# Patient Record
Sex: Female | Born: 1970 | Race: Black or African American | Hispanic: No | Marital: Single | Smoking: Current every day smoker
Health system: Southern US, Community
[De-identification: ages and names within clinical notes are randomized; demographics above are authoritative.]

## PROBLEM LIST (undated history)

## (undated) HISTORY — PX: TONSILLECTOMY: SUR1361

## (undated) HISTORY — PX: TUBAL LIGATION: SHX77

---

## 2015-10-25 ENCOUNTER — Encounter (HOSPITAL_BASED_OUTPATIENT_CLINIC_OR_DEPARTMENT_OTHER): Payer: Self-pay

## 2015-10-25 ENCOUNTER — Emergency Department (HOSPITAL_BASED_OUTPATIENT_CLINIC_OR_DEPARTMENT_OTHER)
Admission: EM | Admit: 2015-10-25 | Discharge: 2015-10-25 | Discharge status: Against Medical Advice | Payer: No Typology Code available for payment source | Attending: Emergency Medicine | Admitting: Emergency Medicine

## 2015-10-25 DIAGNOSIS — Y9389 Activity, other specified: Secondary | ICD-10-CM | POA: Insufficient documentation

## 2015-10-25 DIAGNOSIS — Y92410 Unspecified street and highway as the place of occurrence of the external cause: Secondary | ICD-10-CM | POA: Insufficient documentation

## 2015-10-25 DIAGNOSIS — Y998 Other external cause status: Secondary | ICD-10-CM | POA: Diagnosis not present

## 2015-10-25 DIAGNOSIS — S59912A Unspecified injury of left forearm, initial encounter: Secondary | ICD-10-CM | POA: Insufficient documentation

## 2015-10-25 DIAGNOSIS — F17210 Nicotine dependence, cigarettes, uncomplicated: Secondary | ICD-10-CM | POA: Diagnosis not present

## 2015-10-25 NOTE — ED Notes (Signed)
Pt stated I'm okay, informed pt doctor had signed up for them and they should be over shortly.  Pt stated again I'm fine.
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
## 2018-05-21 ENCOUNTER — Emergency Department (HOSPITAL_BASED_OUTPATIENT_CLINIC_OR_DEPARTMENT_OTHER): Payer: 59

## 2018-05-21 ENCOUNTER — Inpatient Hospital Stay (HOSPITAL_BASED_OUTPATIENT_CLINIC_OR_DEPARTMENT_OTHER)
Admission: EM | Admit: 2018-05-21 | Discharge: 2018-05-23 | DRG: 392 | Disposition: A | Discharge status: Home / Self Care | Payer: 59 | Attending: Internal Medicine | Admitting: Internal Medicine

## 2018-05-21 ENCOUNTER — Encounter (HOSPITAL_BASED_OUTPATIENT_CLINIC_OR_DEPARTMENT_OTHER): Payer: Self-pay | Admitting: Emergency Medicine

## 2018-05-21 ENCOUNTER — Other Ambulatory Visit: Payer: Self-pay

## 2018-05-21 DIAGNOSIS — N83202 Unspecified ovarian cyst, left side: Secondary | ICD-10-CM | POA: Diagnosis present

## 2018-05-21 DIAGNOSIS — K5720 Diverticulitis of large intestine with perforation and abscess without bleeding: Secondary | ICD-10-CM | POA: Diagnosis not present

## 2018-05-21 DIAGNOSIS — K5792 Diverticulitis of intestine, part unspecified, without perforation or abscess without bleeding: Secondary | ICD-10-CM | POA: Diagnosis present

## 2018-05-21 DIAGNOSIS — R112 Nausea with vomiting, unspecified: Secondary | ICD-10-CM | POA: Diagnosis present

## 2018-05-21 DIAGNOSIS — F17210 Nicotine dependence, cigarettes, uncomplicated: Secondary | ICD-10-CM | POA: Diagnosis present

## 2018-05-21 LAB — CBC WITH DIFFERENTIAL/PLATELET
Abs Immature Granulocytes: 0.06 10*3/uL (ref 0.00–0.07)
BASOS PCT: 0 %
Basophils Absolute: 0 10*3/uL (ref 0.0–0.1)
EOS ABS: 0 10*3/uL (ref 0.0–0.5)
EOS PCT: 0 %
HCT: 40.3 % (ref 36.0–46.0)
Hemoglobin: 13.2 g/dL (ref 12.0–15.0)
Immature Granulocytes: 1 %
Lymphocytes Relative: 19 %
Lymphs Abs: 2.4 10*3/uL (ref 0.7–4.0)
MCH: 28 pg (ref 26.0–34.0)
MCHC: 32.8 g/dL (ref 30.0–36.0)
MCV: 85.6 fL (ref 80.0–100.0)
MONO ABS: 1.2 10*3/uL — AB (ref 0.1–1.0)
MONOS PCT: 10 %
Neutro Abs: 9 10*3/uL — ABNORMAL HIGH (ref 1.7–7.7)
Neutrophils Relative %: 70 %
PLATELETS: 207 10*3/uL (ref 150–400)
RBC: 4.71 MIL/uL (ref 3.87–5.11)
RDW: 13.3 % (ref 11.5–15.5)
WBC: 12.7 10*3/uL — ABNORMAL HIGH (ref 4.0–10.5)
nRBC: 0 % (ref 0.0–0.2)

## 2018-05-21 LAB — COMPREHENSIVE METABOLIC PANEL
ALT: 17 U/L (ref 0–44)
AST: 14 U/L — AB (ref 15–41)
Albumin: 3.8 g/dL (ref 3.5–5.0)
Alkaline Phosphatase: 67 U/L (ref 38–126)
Anion gap: 9 (ref 5–15)
BILIRUBIN TOTAL: 0.5 mg/dL (ref 0.3–1.2)
BUN: 10 mg/dL (ref 6–20)
CALCIUM: 9.3 mg/dL (ref 8.9–10.3)
CO2: 25 mmol/L (ref 22–32)
Chloride: 104 mmol/L (ref 98–111)
Creatinine, Ser: 0.84 mg/dL (ref 0.44–1.00)
GFR calc Af Amer: >60 mL/min (ref >60)
Glucose, Bld: 176 mg/dL — ABNORMAL HIGH (ref 70–99)
Potassium: 3.4 mmol/L — ABNORMAL LOW (ref 3.5–5.1)
Sodium: 138 mmol/L (ref 135–145)
TOTAL PROTEIN: 7 g/dL (ref 6.5–8.1)

## 2018-05-21 LAB — LIPASE, BLOOD: LIPASE: 30 U/L (ref 11–51)

## 2018-05-21 MED ORDER — SODIUM CHLORIDE 0.9 % IV SOLN
INTRAVENOUS | Status: DC
  Administered 2018-05-21 (×2): via INTRAVENOUS

## 2018-05-21 MED ORDER — ENOXAPARIN SODIUM 40 MG/0.4ML ~~LOC~~ SOLN
40.0000 mg | SUBCUTANEOUS | Status: DC
  Administered 2018-05-21 – 2018-05-22 (×2): 40 mg via SUBCUTANEOUS
  Filled 2018-05-21 (×2): qty 0.4

## 2018-05-21 MED ORDER — CIPROFLOXACIN IN D5W 400 MG/200ML IV SOLN
400.0000 mg | Freq: Two times a day (BID) | INTRAVENOUS | Status: DC
  Administered 2018-05-21: 400 mg via INTRAVENOUS
  Filled 2018-05-21: qty 200

## 2018-05-21 MED ORDER — IOPAMIDOL (ISOVUE-300) INJECTION 61%
30.0000 mL | Freq: Once | INTRAVENOUS | Status: AC | PRN
  Administered 2018-05-21: 15 mL via ORAL

## 2018-05-21 MED ORDER — METRONIDAZOLE IN NACL 5-0.79 MG/ML-% IV SOLN
500.0000 mg | Freq: Three times a day (TID) | INTRAVENOUS | Status: DC
  Administered 2018-05-21 – 2018-05-22 (×2): 500 mg via INTRAVENOUS
  Filled 2018-05-21 (×2): qty 100

## 2018-05-21 MED ORDER — ENOXAPARIN SODIUM 40 MG/0.4ML ~~LOC~~ SOLN: 40.0000 mg | SUBCUTANEOUS | Status: DC

## 2018-05-21 MED ORDER — POTASSIUM CHLORIDE 10 MEQ/100ML IV SOLN
10.0000 meq | INTRAVENOUS | Status: AC
  Administered 2018-05-21 (×2): 10 meq via INTRAVENOUS
  Filled 2018-05-21 (×2): qty 100

## 2018-05-21 MED ORDER — CIPROFLOXACIN IN D5W 400 MG/200ML IV SOLN
400.0000 mg | Freq: Once | INTRAVENOUS | Status: AC
  Administered 2018-05-21: 400 mg via INTRAVENOUS
  Filled 2018-05-21: qty 200

## 2018-05-21 MED ORDER — SODIUM CHLORIDE 0.9 % IV SOLN: INTRAVENOUS | Status: DC | PRN

## 2018-05-21 MED ORDER — HYDROMORPHONE HCL 1 MG/ML IJ SOLN
0.5000 mg | INTRAMUSCULAR | Status: DC | PRN
  Administered 2018-05-21: 0.5 mg via INTRAVENOUS
  Filled 2018-05-21: qty 0.5

## 2018-05-21 MED ORDER — SODIUM CHLORIDE 0.9 % IV SOLN
INTRAVENOUS | Status: DC
  Administered 2018-05-21 – 2018-05-22 (×3): via INTRAVENOUS

## 2018-05-21 MED ORDER — METRONIDAZOLE IN NACL 5-0.79 MG/ML-% IV SOLN
500.0000 mg | Freq: Once | INTRAVENOUS | Status: DC
  Filled 2018-05-21: qty 100

## 2018-05-21 MED ORDER — IOPAMIDOL (ISOVUE-300) INJECTION 61%
100.0000 mL | Freq: Once | INTRAVENOUS | Status: AC | PRN
  Administered 2018-05-21: 100 mL via INTRAVENOUS

## 2018-05-21 NOTE — ED Triage Notes (Signed)
Abd pain across upper abdomen since noon yesterday.  Vomited twice yesterday.  At some bread today and had ginger ale today and did not vomit again. Denies diarrhea.

## 2018-05-21 NOTE — ED Notes (Signed)
Patient transported to CT 

## 2018-05-21 NOTE — H&P (Addendum)
History and Physical    Chelsea Rowe ZOX:[id number] DOB: 1970-09-19 DOA: 05/21/2018  PCP: [facility] Patient coming from: Home    Chief Complaint: Abdominal pain  HPI: Chelsea Rowe is a 47 y.o. female with no significant past medical history transferred from [facility] with abdominal pain started day prior to admission to the hospital associated with nausea and vomiting.  She denied any fever but had chills at home no diarrhea or constipation.  She denies history of IBS.  She denies chest pain shortness of breath cough.  She denies urinary complaints.  No history of diverticulitis.  As soon as she reached the room she asked for food and she wanted to eat.  ED Course: Patient received Cipro and Flagyl ED physician spoke with Dr. Cliffton Asters with general surgery who has already seen the patient.  CT scan of the abdomen and pelvis showed sigmoid diverticulitis with question contained perforation.    Review of Systems: See HPI.   History reviewed. No pertinent past medical history.  Past Surgical History:  Procedure Laterality Date  . TONSILLECTOMY    . TUBAL LIGATION       reports that she has been smoking cigarettes. She has been smoking about 0.50 packs per day. She has never used smokeless tobacco. She reports that she does not drink alcohol or use drugs.  No Known Allergies  History reviewed. No pertinent family history.   Prior to Admission medications   Medication Sig Start Date End Date Taking? Authorizing Provider  simethicone (MYLICON) 80 MG chewable tablet Chew 80 mg by mouth daily as needed for flatulence.   Yes [provider]    Physical Exam: Vitals:   05/21/18 0855 05/21/18 1144 05/21/18 1356 05/21/18 1558  BP:  111/77 117/67 132/89  Pulse:  90 86 97  Resp:  20 18 20   Temp:    99.8 F (37.7 C)  TempSrc:    Oral  SpO2:  99% 100% 100%  Weight: 99.8 kg     Height: 5\' 5"  (1.651 m)       Constitutional:  NAD, calm, comfortable Vitals:   05/21/18 0855 05/21/18 1144 05/21/18 1356 05/21/18 1558  BP:  111/77 117/67 132/89  Pulse:  90 86 97  Resp:  20 18 20   Temp:    99.8 F (37.7 C)  TempSrc:    Oral  SpO2:  99% 100% 100%  Weight: 99.8 kg     Height: 5\' 5"  (1.651 m)      Eyes: PERRL, lids and conjunctivae normal ENMT: Mucous membranes are moist. Posterior pharynx clear of any exudate or lesions.Normal dentition.  Neck: normal, supple, no masses, no thyromegaly Respiratory: clear to auscultation bilaterally, no wheezing, no crackles. Normal respiratory effort. No accessory muscle use.  Cardiovascular: Regular rate and rhythm, no murmurs / rubs / gallops. No extremity edema. 2+ pedal pulses. No carotid bruits.  Abdomen:mild tender  Lower abdomen tenderness, no masses palpated. No hepatosplenomegaly. Bowel sounds positive.  Musculoskeletal: no clubbing / cyanosis. No joint deformity upper and lower extremities. Good ROM, no contractures. Normal muscle tone.  Skin: no rashes, lesions, ulcers. No induration Neurologic: CN 2-12 grossly intact. Sensation intact, DTR normal. Strength 5/5 in all 4.  Psychiatric: Normal judgment and insight. Alert and oriented x 3. Normal mood.   Labs on Admission: I have personally reviewed following labs and imaging studies  CBC: Recent Labs  Lab 05/21/18 1013  WBC 12.7*  NEUTROABS 9.0*  HGB 13.2  HCT  40.3  MCV 85.6  PLT 207   Basic Metabolic Panel: Recent Labs  Lab 05/21/18 1013  NA 138  K 3.4*  CL 104  CO2 25  GLUCOSE 176*  BUN 10  CREATININE 0.84  CALCIUM 9.3   GFR: Estimated Creatinine Clearance: 100 mL/min (by C-G formula based on SCr of 0.84 mg/dL). Liver Function Tests: Recent Labs  Lab 05/21/18 1013  AST 14*  ALT 17  ALKPHOS 67  BILITOT 0.5  PROT 7.0  ALBUMIN 3.8   Recent Labs  Lab 05/21/18 1013  LIPASE 30   No results for input(s): AMMONIA in the last 168 hours. Coagulation Profile: No results for input(s): INR,  PROTIME in the last 168 hours. Cardiac Enzymes: No results for input(s): CKTOTAL, CKMB, CKMBINDEX, TROPONINI in the last 168 hours. BNP (last 3 results) No results for input(s): PROBNP in the last 8760 hours. HbA1C: No results for input(s): HGBA1C in the last 72 hours. CBG: No results for input(s): GLUCAP in the last 168 hours. Lipid Profile: No results for input(s): CHOL, HDL, LDLCALC, TRIG, CHOLHDL, LDLDIRECT in the last 72 hours. Thyroid Function Tests: No results for input(s): TSH, T4TOTAL, FREET4, T3FREE, THYROIDAB in the last 72 hours. Anemia Panel: No results for input(s): VITAMINB12, FOLATE, FERRITIN, TIBC, IRON, RETICCTPCT in the last 72 hours. Urine analysis: No results found for: COLORURINE, APPEARANCEUR, LABSPEC, PHURINE, GLUCOSEU, HGBUR, BILIRUBINUR, KETONESUR, PROTEINUR, UROBILINOGEN, NITRITE, LEUKOCYTESUR  Radiological Exams on Admission: Ct Abdomen Pelvis W Contrast  Result Date: 05/21/2018 CLINICAL DATA:  Acute upper abdominal pain. EXAM: CT ABDOMEN AND PELVIS WITH CONTRAST TECHNIQUE: Multidetector CT imaging of the abdomen and pelvis was performed using the standard protocol following bolus administration of intravenous contrast. CONTRAST:  15mL ISOVUE-300 IOPAMIDOL (ISOVUE-300) INJECTION 61% orally, ISOVUE-300 IOPAMIDOL (ISOVUE-300) INJECTION 61% intravenously. COMPARISON:  None. FINDINGS: Lower chest: No acute abnormality. Hepatobiliary: No focal liver abnormality is seen. No gallstones, gallbladder wall thickening, or biliary dilatation. Pancreas: Unremarkable. No pancreatic ductal dilatation or surrounding inflammatory changes. Spleen: Normal in size without focal abnormality. Adrenals/Urinary Tract: Adrenal glands are unremarkable. Kidneys are normal, without renal calculi, focal lesion, or hydronephrosis. Bladder is unremarkable. Stomach/Bowel: The stomach and appendix appear normal. There is no evidence of bowel obstruction. Sigmoid diverticulitis is noted with  collection of fluid and gas measuring 3.8 x 2.6 cm adjacent to sigmoid colon consistent with contained perforation and developing phlegmon or abscess. There is wall thickening of adjacent terminal ileum which most likely is secondary inflammation. Vascular/Lymphatic: No significant vascular findings are present. No enlarged abdominal or pelvic lymph nodes. Reproductive: Uterus and right ovary are unremarkable. 4.1 cm left ovarian cyst is noted. Other: Small amount of free fluid is noted in the pelvis which may be inflammatory in etiology. Musculoskeletal: No acute or significant osseous findings. IMPRESSION: Sigmoid diverticulitis is noted with adjacent 3.8 x 2.6 cm abnormality containing gas and fluid concerning for contained perforation and developing phlegmon or abscess. This is resulting in secondary inflammatory changes involving the adjacent terminal ileum. 4.1 cm left ovarian cyst is noted. Ultrasound is recommended for further evaluation. Electronically Signed   By: Lupita Raider, M.D.   On: 05/21/2018 11:59   Assessment/Plan Active Problems:   Diverticulitis #1 acute diverticulitis involving the sigmoid colon with CT findings secondary to inflammatory changes phlegmon versus abscess versus contained perforation.  General surgery following.  Patient kept n.p.o. on IV fluids and IV antibiotics. DVT prophylaxis: Lovenox Code Status: Full code Family Communication: None Disposition Plan: Pending clinical improvement Consults  called: General surgery Admission status: Inpatient   Alwyn Ren MD [facility] If 7PM-7AM, please contact night-coverage [email or web address] Password [email or web address]  05/21/2018, 5:22 PM

## 2018-05-21 NOTE — ED Provider Notes (Signed)
[facility] EMERGENCY DEPARTMENT Provider Note   CSN: [id number] Arrival date & time: 05/21/18  4098     History   Chief Complaint Chief Complaint  Patient presents with  . Abdominal Pain    HPI Chelsea Rowe is a 47 y.o. female.   Patient with complaint of abdominal pain across the umbilicus area.  Started at noontime yesterday.  Yesterday she vomited twice.  Nausea has persisted no diarrhea.  No blood in the vomit.  Pain has persisted.  Denies fevers.  Denies dysuria.  Bowel movements without blood.  No history of pain like this in the past.     History reviewed. No pertinent past medical history.  There are no active problems to display for this patient.   Past Surgical History:  Procedure Laterality Date  . TONSILLECTOMY    . TUBAL LIGATION       OB History   None      Home Medications    Prior to Admission medications   Not on File    Family History No family history on file.  Social History Social History   Tobacco Use  . Smoking status: Current Every Day Smoker    Packs/day: 0.50    Types: Cigarettes  . Smokeless tobacco: Never Used  Substance Use Topics  . Alcohol use: No  . Drug use: No     Allergies   Patient has no known allergies.   Review of Systems Review of Systems  Constitutional: Negative for fever.  HENT: Negative for congestion.   Eyes: Negative for redness and visual disturbance.  Respiratory: Negative for shortness of breath.   Cardiovascular: Negative for chest pain.  Gastrointestinal: Positive for abdominal pain, nausea and vomiting. Negative for blood in stool and diarrhea.  Genitourinary: Negative for dysuria.  Musculoskeletal: Negative for back pain.  Skin: Negative for rash.  Neurological: Negative for syncope and headaches.  Hematological: Does not bruise/bleed easily.  Psychiatric/Behavioral: Negative for confusion.     Physical Exam Updated Vital Signs BP 111/77 (BP Location: Left  Arm)   Pulse 90   Temp 98.8 F (37.1 C) (Oral)   Resp 20   Ht 1.651 m (5\' 5" )   Wt 99.8 kg   LMP 05/07/2018   SpO2 99%   BMI 36.61 kg/m   Physical Exam  Constitutional: She is oriented to person, place, and time. She appears well-developed and well-nourished. No distress.  HENT:  Head: Normocephalic and atraumatic.  Mouth/Throat: Oropharynx is clear and moist.  Eyes: Pupils are equal, round, and reactive to light. Conjunctivae and EOM are normal.  Neck: Neck supple.  Cardiovascular: Normal rate, regular rhythm and normal heart sounds.  Pulmonary/Chest: Effort normal and breath sounds normal.  Abdominal: Soft. Bowel sounds are normal. There is tenderness.  Some mild tenderness left side of abdomen.  Musculoskeletal: Normal range of motion. She exhibits no edema.  Neurological: She is alert and oriented to person, place, and time. No cranial nerve deficit or sensory deficit. She exhibits normal muscle tone. Coordination normal.  Skin: Skin is warm. No rash noted.  Nursing note and vitals reviewed.    ED Treatments / Results  Labs (all labs ordered are listed, but only abnormal results are displayed) Labs Reviewed  COMPREHENSIVE METABOLIC PANEL - Abnormal; Notable for the following components:      Result Value   Potassium 3.4 (*)    Glucose, Bld 176 (*)    AST 14 (*)    All other components within normal  limits  CBC WITH DIFFERENTIAL/PLATELET - Abnormal; Notable for the following components:   WBC 12.7 (*)    Neutro Abs 9.0 (*)    Monocytes Absolute 1.2 (*)    All other components within normal limits  LIPASE, BLOOD    EKG None  Radiology Ct Abdomen Pelvis W Contrast  Result Date: 05/21/2018 CLINICAL DATA:  Acute upper abdominal pain. EXAM: CT ABDOMEN AND PELVIS WITH CONTRAST TECHNIQUE: Multidetector CT imaging of the abdomen and pelvis was performed using the standard protocol following bolus administration of intravenous contrast. CONTRAST:  15mL ISOVUE-300  IOPAMIDOL (ISOVUE-300) INJECTION 61% orally, ISOVUE-300 IOPAMIDOL (ISOVUE-300) INJECTION 61% intravenously. COMPARISON:  None. FINDINGS: Lower chest: No acute abnormality. Hepatobiliary: No focal liver abnormality is seen. No gallstones, gallbladder wall thickening, or biliary dilatation. Pancreas: Unremarkable. No pancreatic ductal dilatation or surrounding inflammatory changes. Spleen: Normal in size without focal abnormality. Adrenals/Urinary Tract: Adrenal glands are unremarkable. Kidneys are normal, without renal calculi, focal lesion, or hydronephrosis. Bladder is unremarkable. Stomach/Bowel: The stomach and appendix appear normal. There is no evidence of bowel obstruction. Sigmoid diverticulitis is noted with collection of fluid and gas measuring 3.8 x 2.6 cm adjacent to sigmoid colon consistent with contained perforation and developing phlegmon or abscess. There is wall thickening of adjacent terminal ileum which most likely is secondary inflammation. Vascular/Lymphatic: No significant vascular findings are present. No enlarged abdominal or pelvic lymph nodes. Reproductive: Uterus and right ovary are unremarkable. 4.1 cm left ovarian cyst is noted. Other: Small amount of free fluid is noted in the pelvis which may be inflammatory in etiology. Musculoskeletal: No acute or significant osseous findings. IMPRESSION: Sigmoid diverticulitis is noted with adjacent 3.8 x 2.6 cm abnormality containing gas and fluid concerning for contained perforation and developing phlegmon or abscess. This is resulting in secondary inflammatory changes involving the adjacent terminal ileum. 4.1 cm left ovarian cyst is noted. Ultrasound is recommended for further evaluation. Electronically Signed   By: Lupita Raider, M.D.   On: 05/21/2018 11:59    Procedures Procedures (including critical care time)  Medications Ordered in ED Medications  0.9 %  sodium chloride infusion ( Intravenous Stopped 05/21/18 1250)    ciprofloxacin (CIPRO) IVPB 400 mg (400 mg Intravenous New Bag/Given 05/21/18 1255)  metroNIDAZOLE (FLAGYL) IVPB 500 mg (has no administration in time range)  0.9 %  sodium chloride infusion (1,000 mLs Intravenous New Bag/Given 05/21/18 1253)  iopamidol (ISOVUE-300) 61 % injection 30 mL (15 mLs Oral Contrast Given 05/21/18 1112)  iopamidol (ISOVUE-300) 61 % injection 100 mL (100 mLs Intravenous Contrast Given 05/21/18 1113)     Initial Impression / Assessment and Plan / ED Course  I have reviewed the triage vital signs and the nursing notes.  Pertinent labs & imaging results that were available during my care of the patient were reviewed by me and considered in my medical decision making (see chart for details).    CT scan shows evidence of sigmoid diverticulitis.  There appears to be a contained perforation questionable abscess or phlegmon development.  Patient nontoxic no acute distress here.  Discussed with Dr. Cliffton Asters on call for general surgery at [facility] [place].  He reviewed her CT scan.  He agrees with plan for admission and IV antibiotics and observation due to this finding.  Patient has a known history of diverticulitis in the past.  But this would be considered a complication.  Patient not showing any signs of generalized peritonitis.  No signs of sepsis.  Patient started on IV  Cipro and IV Flagyl based on these findings.  Discussed with hospitalist at [facility] they will admit her to the MedSurg unit there.  Labs without significant abnormalities other than a mild leukocytosis with a white count of 12,000.   Final Clinical Impressions(s) / ED Diagnoses   Final diagnoses:  Abscess of sigmoid colon due to diverticulitis    ED Discharge Orders    None       Vanetta Mulders, MD 05/21/18 1430

## 2018-05-21 NOTE — Consult Note (Signed)
CC: Diverticulitis - consult by Dr. Rogene Houston  HPI: Chelsea Rowe is an 47 y.o. female with no known past medical hx whom presented to [facility] ED today complaining of crampy and constant abdominal pain in the hypogastrium and RLQ which began yesterday around noon. Pain does not radiate. Had n/v x1 yesterday. Thinks she may have had a less severe version of this in the weeks prior. Subjective chills at home. Having flatus through all this; denies diarrhea or constipation  She has never had a colonoscopy  She denies personal or FHx of colorectal cancer, IBS, Crohn's disease or ulcerative colitis  History reviewed. No pertinent past medical history.  Past Surgical History:  Procedure Laterality Date  . TONSILLECTOMY    . TUBAL LIGATION      History reviewed. No pertinent family history.  Social:  reports that she has been smoking cigarettes. She has been smoking about 0.50 packs per day. She has never used smokeless tobacco. She reports that she does not drink alcohol or use drugs.  Allergies: No Known Allergies  Medications: I have reviewed the patient's current medications.  Results for orders placed or performed during the hospital encounter of 05/21/18 (from the past 48 hour(s))  Comprehensive metabolic panel     Status: Abnormal   Collection Time: 05/21/18 10:13 AM  Result Value Ref Range   Sodium 138 135 - 145 mmol/L   Potassium 3.4 (L) 3.5 - 5.1 mmol/L   Chloride 104 98 - 111 mmol/L   CO2 25 22 - 32 mmol/L   Glucose, Bld 176 (H) 70 - 99 mg/dL   BUN 10 6 - 20 mg/dL   Creatinine, Ser 0.84 0.44 - 1.00 mg/dL   Calcium 9.3 8.9 - 10.3 mg/dL   Total Protein 7.0 6.5 - 8.1 g/dL   Albumin 3.8 3.5 - 5.0 g/dL   AST 14 (L) 15 - 41 U/L   ALT 17 0 - 44 U/L   Alkaline Phosphatase 67 38 - 126 U/L   Total Bilirubin 0.5 0.3 - 1.2 mg/dL   GFR calc non Af Amer >60 >60 mL/min   GFR calc Af Amer >60 >60 mL/min    Comment: (NOTE) The eGFR has been calculated using the CKD EPI  equation. This calculation has not been validated in all clinical situations. eGFR's persistently <60 mL/min signify possible Chronic Kidney Disease.    Anion gap 9 5 - 15    Comment: Performed at [facility], [address] [place], [place] [address]  Lipase, blood     Status: None   Collection Time: 05/21/18 10:13 AM  Result Value Ref Range   Lipase 30 11 - 51 U/L    Comment: Performed at [facility], [address] [place], [place] [address]  CBC with Differential/Platelet     Status: Abnormal   Collection Time: 05/21/18 10:13 AM  Result Value Ref Range   WBC 12.7 (H) 4.0 - 10.5 K/uL   RBC 4.71 3.87 - 5.11 MIL/uL   Hemoglobin 13.2 12.0 - 15.0 g/dL   HCT 40.3 36.0 - 46.0 %   MCV 85.6 80.0 - 100.0 fL   MCH 28.0 26.0 - 34.0 pg   MCHC 32.8 30.0 - 36.0 g/dL   RDW 13.3 11.5 - 15.5 %   Platelets 207 150 - 400 K/uL   nRBC 0.0 0.0 - 0.2 %   Neutrophils Relative % 70 %   Neutro Abs 9.0 (H) 1.7 - 7.7 K/uL  Lymphocytes Relative 19 %   Lymphs Abs 2.4 0.7 - 4.0 K/uL   Monocytes Relative 10 %   Monocytes Absolute 1.2 (H) 0.1 - 1.0 K/uL   Eosinophils Relative 0 %   Eosinophils Absolute 0.0 0.0 - 0.5 K/uL   Basophils Relative 0 %   Basophils Absolute 0.0 0.0 - 0.1 K/uL   Immature Granulocytes 1 %   Abs Immature Granulocytes 0.06 0.00 - 0.07 K/uL    Comment: Performed at [facility], [address] [place], [place] [address]    Ct Abdomen Pelvis W Contrast  Result Date: 05/21/2018 CLINICAL DATA:  Acute upper abdominal pain. EXAM: CT ABDOMEN AND PELVIS WITH CONTRAST TECHNIQUE: Multidetector CT imaging of the abdomen and pelvis was performed using the standard protocol following bolus administration of intravenous contrast. CONTRAST:  32m ISOVUE-300 IOPAMIDOL (ISOVUE-300) INJECTION 61% orally, 1076mISOVUE-300 IOPAMIDOL (ISOVUE-300) INJECTION 61% intravenously. COMPARISON:  None. FINDINGS: Lower chest: No acute abnormality. Hepatobiliary: No focal  liver abnormality is seen. No gallstones, gallbladder wall thickening, or biliary dilatation. Pancreas: Unremarkable. No pancreatic ductal dilatation or surrounding inflammatory changes. Spleen: Normal in size without focal abnormality. Adrenals/Urinary Tract: Adrenal glands are unremarkable. Kidneys are normal, without renal calculi, focal lesion, or hydronephrosis. Bladder is unremarkable. Stomach/Bowel: The stomach and appendix appear normal. There is no evidence of bowel obstruction. Sigmoid diverticulitis is noted with collection of fluid and gas measuring 3.8 x 2.6 cm adjacent to sigmoid colon consistent with contained perforation and developing phlegmon or abscess. There is wall thickening of adjacent terminal ileum which most likely is secondary inflammation. Vascular/Lymphatic: No significant vascular findings are present. No enlarged abdominal or pelvic lymph nodes. Reproductive: Uterus and right ovary are unremarkable. 4.1 cm left ovarian cyst is noted. Other: Small amount of free fluid is noted in the pelvis which may be inflammatory in etiology. Musculoskeletal: No acute or significant osseous findings. IMPRESSION: Sigmoid diverticulitis is noted with adjacent 3.8 x 2.6 cm abnormality containing gas and fluid concerning for contained perforation and developing phlegmon or abscess. This is resulting in secondary inflammatory changes involving the adjacent terminal ileum. 4.1 cm left ovarian cyst is noted. Ultrasound is recommended for further evaluation. Electronically Signed   By: JaMarijo ConceptionM.D.   On: 05/21/2018 11:59    ROS - all of the below systems have been reviewed with the patient and positives are indicated with bold text General: chills, fever or night sweats Eyes: blurry vision or double vision ENT: epistaxis or sore throat Allergy/Immunology: itchy/watery eyes or nasal congestion Hematologic/Lymphatic: bleeding problems, blood clots or swollen lymph nodes Endocrine: temperature  intolerance or unexpected weight changes Breast: new or changing breast lumps or nipple discharge Resp: cough, shortness of breath, or wheezing CV: chest pain or dyspnea on exertion GI: as per HPI GU: dysuria, trouble voiding, or hematuria MSK: joint pain or joint stiffness Neuro: TIA or stroke symptoms Derm: pruritus and skin lesion changes Psych: anxiety and depression  PE Blood pressure 132/89, pulse 97, temperature 99.8 F (37.7 C), temperature source Oral, resp. rate 20, height 5' 5"  (1.651 m), weight 99.8 kg, last menstrual period 05/07/2018, SpO2 100 %. Constitutional: NAD; conversant; no deformities Eyes: Moist conjunctiva; no lid lag; anicteric; PERRL Neck: Trachea midline; no thyromegaly Lungs: Normal respiratory effort; no tactile fremitus CV: RRR; no palpable thrills; no pitting edema GI: Abd obese, soft, mildly ttp in RLQ and hypogastrium; no palpable hepatosplenomegaly. No rebound. No guarding. Not significantly distended. MSK: Normal gait; no clubbing/cyanosis Psychiatric: Appropriate  affect; alert and oriented x3 Lymphatic: No palpable cervical or axillary lymphadenopathy  Results for orders placed or performed during the hospital encounter of 05/21/18 (from the past 48 hour(s))  Comprehensive metabolic panel     Status: Abnormal   Collection Time: 05/21/18 10:13 AM  Result Value Ref Range   Sodium 138 135 - 145 mmol/L   Potassium 3.4 (L) 3.5 - 5.1 mmol/L   Chloride 104 98 - 111 mmol/L   CO2 25 22 - 32 mmol/L   Glucose, Bld 176 (H) 70 - 99 mg/dL   BUN 10 6 - 20 mg/dL   Creatinine, Ser 0.84 0.44 - 1.00 mg/dL   Calcium 9.3 8.9 - 10.3 mg/dL   Total Protein 7.0 6.5 - 8.1 g/dL   Albumin 3.8 3.5 - 5.0 g/dL   AST 14 (L) 15 - 41 U/L   ALT 17 0 - 44 U/L   Alkaline Phosphatase 67 38 - 126 U/L   Total Bilirubin 0.5 0.3 - 1.2 mg/dL   GFR calc non Af Amer >60 >60 mL/min   GFR calc Af Amer >60 >60 mL/min    Comment: (NOTE) The eGFR has been calculated using the CKD EPI  equation. This calculation has not been validated in all clinical situations. eGFR's persistently <60 mL/min signify possible Chronic Kidney Disease.    Anion gap 9 5 - 15    Comment: Performed at [facility], [address] [place], [place] [address]  Lipase, blood     Status: None   Collection Time: 05/21/18 10:13 AM  Result Value Ref Range   Lipase 30 11 - 51 U/L    Comment: Performed at [facility], [address] [place], [place] [address]  CBC with Differential/Platelet     Status: Abnormal   Collection Time: 05/21/18 10:13 AM  Result Value Ref Range   WBC 12.7 (H) 4.0 - 10.5 K/uL   RBC 4.71 3.87 - 5.11 MIL/uL   Hemoglobin 13.2 12.0 - 15.0 g/dL   HCT 40.3 36.0 - 46.0 %   MCV 85.6 80.0 - 100.0 fL   MCH 28.0 26.0 - 34.0 pg   MCHC 32.8 30.0 - 36.0 g/dL   RDW 13.3 11.5 - 15.5 %   Platelets 207 150 - 400 K/uL   nRBC 0.0 0.0 - 0.2 %   Neutrophils Relative % 70 %   Neutro Abs 9.0 (H) 1.7 - 7.7 K/uL   Lymphocytes Relative 19 %   Lymphs Abs 2.4 0.7 - 4.0 K/uL   Monocytes Relative 10 %   Monocytes Absolute 1.2 (H) 0.1 - 1.0 K/uL   Eosinophils Relative 0 %   Eosinophils Absolute 0.0 0.0 - 0.5 K/uL   Basophils Relative 0 %   Basophils Absolute 0.0 0.0 - 0.1 K/uL   Immature Granulocytes 1 %   Abs Immature Granulocytes 0.06 0.00 - 0.07 K/uL    Comment: Performed at [facility], [address] [place], [place] [address]    Ct Abdomen Pelvis W Contrast  Result Date: 05/21/2018 CLINICAL DATA:  Acute upper abdominal pain. EXAM: CT ABDOMEN AND PELVIS WITH CONTRAST TECHNIQUE: Multidetector CT imaging of the abdomen and pelvis was performed using the standard protocol following bolus administration of intravenous contrast. CONTRAST:  72m ISOVUE-300 IOPAMIDOL (ISOVUE-300) INJECTION 61% orally, 1064mISOVUE-300 IOPAMIDOL (ISOVUE-300) INJECTION 61% intravenously. COMPARISON:  None. FINDINGS: Lower chest: No acute abnormality. Hepatobiliary: No focal  liver abnormality is seen. No gallstones, gallbladder wall thickening,  or biliary dilatation. Pancreas: Unremarkable. No pancreatic ductal dilatation or surrounding inflammatory changes. Spleen: Normal in size without focal abnormality. Adrenals/Urinary Tract: Adrenal glands are unremarkable. Kidneys are normal, without renal calculi, focal lesion, or hydronephrosis. Bladder is unremarkable. Stomach/Bowel: The stomach and appendix appear normal. There is no evidence of bowel obstruction. Sigmoid diverticulitis is noted with collection of fluid and gas measuring 3.8 x 2.6 cm adjacent to sigmoid colon consistent with contained perforation and developing phlegmon or abscess. There is wall thickening of adjacent terminal ileum which most likely is secondary inflammation. Vascular/Lymphatic: No significant vascular findings are present. No enlarged abdominal or pelvic lymph nodes. Reproductive: Uterus and right ovary are unremarkable. 4.1 cm left ovarian cyst is noted. Other: Small amount of free fluid is noted in the pelvis which may be inflammatory in etiology. Musculoskeletal: No acute or significant osseous findings. IMPRESSION: Sigmoid diverticulitis is noted with adjacent 3.8 x 2.6 cm abnormality containing gas and fluid concerning for contained perforation and developing phlegmon or abscess. This is resulting in secondary inflammatory changes involving the adjacent terminal ileum. 4.1 cm left ovarian cyst is noted. Ultrasound is recommended for further evaluation. Electronically Signed   By: Marijo Conception, M.D.   On: 05/21/2018 11:59    A/P: Chelsea Rowe is an 47 y.o. female with sigmoid diverticulitis and area of phlegmon 3.8 x 2.6 cm  - secondary inflammatory changes noted involving adjacent TI  -NPO, MIVF -IV abx -Repeat labs in AM -Will follow with you; no planned acute surgical interventions at this juncture -Should she improve, discussed that she will need a colonoscopy as an outpatient  in ~8wks out from this attack -We discussed the anatomy and physiology of the GI tract with her.  We discussed the pathophysiology of diverticulitis.  We discussed the treatment plan moving forward, I answered her questions to her satisfaction and she voiced understanding with the plan  Sharon Mt. Dema Severin, M.D. [facility]

## 2018-05-21 NOTE — ED Notes (Signed)
Report to Calvin, RN at [facility].  

## 2018-05-22 DIAGNOSIS — K5792 Diverticulitis of intestine, part unspecified, without perforation or abscess without bleeding: Secondary | ICD-10-CM

## 2018-05-22 LAB — CBC
HCT: 36.4 % (ref 36.0–46.0)
HEMOGLOBIN: 11.6 g/dL — AB (ref 12.0–15.0)
MCH: 27.6 pg (ref 26.0–34.0)
MCHC: 31.9 g/dL (ref 30.0–36.0)
MCV: 86.5 fL (ref 80.0–100.0)
Platelets: 195 10*3/uL (ref 150–400)
RBC: 4.21 MIL/uL (ref 3.87–5.11)
RDW: 13.3 % (ref 11.5–15.5)
WBC: 9.1 10*3/uL (ref 4.0–10.5)
nRBC: 0 % (ref 0.0–0.2)

## 2018-05-22 LAB — COMPREHENSIVE METABOLIC PANEL
ALT: 13 U/L (ref 0–44)
AST: 11 U/L — ABNORMAL LOW (ref 15–41)
Albumin: 3.4 g/dL — ABNORMAL LOW (ref 3.5–5.0)
Alkaline Phosphatase: 62 U/L (ref 38–126)
Anion gap: 8 (ref 5–15)
BILIRUBIN TOTAL: 0.8 mg/dL (ref 0.3–1.2)
BUN: 8 mg/dL (ref 6–20)
CHLORIDE: 107 mmol/L (ref 98–111)
CO2: 23 mmol/L (ref 22–32)
Calcium: 8.5 mg/dL — ABNORMAL LOW (ref 8.9–10.3)
Creatinine, Ser: 0.71 mg/dL (ref 0.44–1.00)
GFR calc Af Amer: >60 mL/min (ref >60)
GLUCOSE: 130 mg/dL — AB (ref 70–99)
Potassium: 3.6 mmol/L (ref 3.5–5.1)
SODIUM: 138 mmol/L (ref 135–145)
TOTAL PROTEIN: 6.5 g/dL (ref 6.5–8.1)

## 2018-05-22 MED ORDER — CIPROFLOXACIN HCL 500 MG PO TABS
500.0000 mg | ORAL_TABLET | Freq: Two times a day (BID) | ORAL | Status: DC
  Administered 2018-05-22 – 2018-05-23 (×3): 500 mg via ORAL
  Filled 2018-05-22 (×3): qty 1

## 2018-05-22 MED ORDER — METRONIDAZOLE 500 MG PO TABS
500.0000 mg | ORAL_TABLET | Freq: Three times a day (TID) | ORAL | Status: DC
  Administered 2018-05-22 – 2018-05-23 (×4): 500 mg via ORAL
  Filled 2018-05-22 (×4): qty 1

## 2018-05-22 MED ORDER — SACCHAROMYCES BOULARDII 250 MG PO CAPS
250.0000 mg | ORAL_CAPSULE | Freq: Two times a day (BID) | ORAL | Status: DC
  Administered 2018-05-22 – 2018-05-23 (×3): 250 mg via ORAL
  Filled 2018-05-22 (×3): qty 1

## 2018-05-22 NOTE — Progress Notes (Signed)
[facility] Progress Note     Subjective: CC-  States that she is feeling better today than yesterday. Currently she only reports very mild, dull lower abdominal pain. Denies n/v. 1 loose BM yesterday.  WBC 9.1, afebrile.  Objective: Vital signs in last 24 hours: Temp:  [98.2 F (36.8 C)-99.8 F (37.7 C)] 98.2 F (36.8 C) (10/25 0513) Pulse Rate:  [80-104] 80 (10/25 0513) Resp:  [17-20] 17 (10/25 0513) BP: (111-132)/(65-89) 112/80 (10/25 0513) SpO2:  [99 %-100 %] 99 % (10/25 0513) Weight:  [99.8 kg] 99.8 kg (10/24 0855) Last BM Date: 05/21/18  Intake/Output from previous day: 10/24 0701 - 10/25 0700 In: 2967.9 [I.V.:1459.3; IV Piggyback:1508.6] Out: -  Intake/Output this shift: No intake/output data recorded.  PE: Gen:  Alert, NAD, pleasant HEENT: EOM's intact, pupils equal and round Card:  RRR Pulm:  CTAB, no W/R/R, effort normal Abd: Soft, NT/ND, +BS, no HSM Ext:  Calves soft and nontender Psych: A&Ox3  Skin: no rashes noted, warm and dry  Lab Results:  Recent Labs    05/21/18 1013 05/22/18 0604  WBC 12.7* 9.1  HGB 13.2 11.6*  HCT 40.3 36.4  PLT 207 195   BMET Recent Labs    05/21/18 1013 05/22/18 0604  NA 138 138  K 3.4* 3.6  CL 104 107  CO2 25 23  GLUCOSE 176* 130*  BUN 10 8  CREATININE 0.84 0.71  CALCIUM 9.3 8.5*   PT/INR No results for input(s): LABPROT, INR in the last 72 hours. CMP     Component Value Date/Time   NA 138 05/22/2018 0604   K 3.6 05/22/2018 0604   CL 107 05/22/2018 0604   CO2 23 05/22/2018 0604   GLUCOSE 130 (H) 05/22/2018 0604   BUN 8 05/22/2018 0604   CREATININE 0.71 05/22/2018 0604   CALCIUM 8.5 (L) 05/22/2018 0604   PROT 6.5 05/22/2018 0604   ALBUMIN 3.4 (L) 05/22/2018 0604   AST 11 (L) 05/22/2018 0604   ALT 13 05/22/2018 0604   ALKPHOS 62 05/22/2018 0604   BILITOT 0.8 05/22/2018 0604   GFRNONAA >60 05/22/2018 0604   GFRAA >60 05/22/2018 0604   Lipase     Component Value Date/Time   LIPASE  30 05/21/2018 1013       Studies/Results: Ct Abdomen Pelvis W Contrast  Result Date: 05/21/2018 CLINICAL DATA:  Acute upper abdominal pain. EXAM: CT ABDOMEN AND PELVIS WITH CONTRAST TECHNIQUE: Multidetector CT imaging of the abdomen and pelvis was performed using the standard protocol following bolus administration of intravenous contrast. CONTRAST:  15mL ISOVUE-300 IOPAMIDOL (ISOVUE-300) INJECTION 61% orally, ISOVUE-300 IOPAMIDOL (ISOVUE-300) INJECTION 61% intravenously. COMPARISON:  None. FINDINGS: Lower chest: No acute abnormality. Hepatobiliary: No focal liver abnormality is seen. No gallstones, gallbladder wall thickening, or biliary dilatation. Pancreas: Unremarkable. No pancreatic ductal dilatation or surrounding inflammatory changes. Spleen: Normal in size without focal abnormality. Adrenals/Urinary Tract: Adrenal glands are unremarkable. Kidneys are normal, without renal calculi, focal lesion, or hydronephrosis. Bladder is unremarkable. Stomach/Bowel: The stomach and appendix appear normal. There is no evidence of bowel obstruction. Sigmoid diverticulitis is noted with collection of fluid and gas measuring 3.8 x 2.6 cm adjacent to sigmoid colon consistent with contained perforation and developing phlegmon or abscess. There is wall thickening of adjacent terminal ileum which most likely is secondary inflammation. Vascular/Lymphatic: No significant vascular findings are present. No enlarged abdominal or pelvic lymph nodes. Reproductive: Uterus and right ovary are unremarkable. 4.1 cm left ovarian cyst is noted. Other: Small amount  of free fluid is noted in the pelvis which may be inflammatory in etiology. Musculoskeletal: No acute or significant osseous findings. IMPRESSION: Sigmoid diverticulitis is noted with adjacent 3.8 x 2.6 cm abnormality containing gas and fluid concerning for contained perforation and developing phlegmon or abscess. This is resulting in secondary inflammatory changes  involving the adjacent terminal ileum. 4.1 cm left ovarian cyst is noted. Ultrasound is recommended for further evaluation. Electronically Signed   By: Lupita Raider, M.D.   On: 05/21/2018 11:59    Anti-infectives: Anti-infectives (From admission, onward)   Start     Dose/Rate Route Frequency Ordered Stop   05/22/18 0815  ciprofloxacin (CIPRO) tablet 500 mg     500 mg Oral 2 times daily 05/22/18 0814     05/22/18 0815  metroNIDAZOLE (FLAGYL) tablet 500 mg     500 mg Oral Every 8 hours 05/22/18 0814     05/22/18 0000  ciprofloxacin (CIPRO) IVPB 400 mg  Status:  Discontinued     400 mg 200 mL/hr over 60 Minutes Intravenous Every 12 hours 05/21/18 1728 05/22/18 0814   05/21/18 2200  metroNIDAZOLE (FLAGYL) IVPB 500 mg  Status:  Discontinued     500 mg 100 mL/hr over 60 Minutes Intravenous Every 8 hours 05/21/18 1728 05/22/18 0814   05/21/18 1300  ciprofloxacin (CIPRO) IVPB 400 mg     400 mg 200 mL/hr over 60 Minutes Intravenous  Once 05/21/18 1247 05/21/18 1355   05/21/18 1300  metroNIDAZOLE (FLAGYL) IVPB 500 mg  Status:  Discontinued     500 mg 100 mL/hr over 60 Minutes Intravenous  Once 05/21/18 1247 05/22/18 4098       Assessment/Plan Sigmoid diverticulitis with phlegmon 3.8 x 2.6 cm   - CT scan 10/24 shows sigmoid diverticulitis is noted with adjacent 3.8 x 2.6 cm abnormality containing gas and fluid concerning for contained perforation and developing phlegmon or abscess; secondary inflammatory changes involving the adjacent terminal ileum - never had a colonoscopy before; if this acute flare up resolves with medical management she will need a colonoscopy in about 8 weeks  ID - IV cipro/flagyl 10/24>>10/25, PO cipro/flagyl 10/25>> FEN - IVF, CLD VTE - SCDs, ok for chemical DVT prophylaxis from surgical standpoint Foley - none  Plan - Pain improved, WBC WNL, and VSS. Advance to clear liquids and transition to oral antibiotics. Encourage ambulation.   LOS: 1 day    Franne Forts , [facility] 05/22/2018, 8:15 AM Pager: [phone] Mon 7:00 am -11:30 AM Tues-Fri 7:00 am-4:30 pm Sat-Sun 7:00 am-11:30 am

## 2018-05-22 NOTE — Progress Notes (Signed)
PROGRESS NOTE    Chelsea Rowe  ZOX:[id number] DOB: 1971-01-09 DOA: 05/21/2018 PCP: [facility]   Brief Narrative: Patient is a 47 year old female with no significant past medical history was transferred from [facility] for the management of diverticulitis.  She presented to the emergency department with complaints of abdominal pain with associated nausea and vomiting.  CT abdomen/pelvis showed sigmoid diverticulitis with question of contained perforation.  General surgery consulted.  Started on antibiotics.  Assessment & Plan:   Active Problems:   Diverticulitis   Abscess of sigmoid colon due to diverticulitis  Acute diverticulitis: CT imaging showed acute diverticulitis involving the sigmoid colon with inflammatory changes suggesting phlegmon versus abscess versus contained perforation. General surgery following.  Her abdominal pain is much better today.  Denies any nausea or vomiting. Had little bowel movement.  She has been advanced to clear liquid diet.  IV antibiotics changed to oral.  Ovarian cyst:4.1 cm left ovarian cyst was noted. Ultrasound is recommended for further evaluation.This can be done as an outpatient.  DVT prophylaxis: Lovenox Code Status: Full Family Communication: None present at the bedside Disposition Plan: Home after improvement in the abdominal pain, full advancement of the diet   Consultants: General surgery  Procedures: None  Antimicrobials: Cipro and Flagyl  Subjective: Patient seen and examined the bedside this morning.  She remains comfortable. Her abdominal pain is much better today.  Denies any nausea or vomiting. Had little bowel movement.  Objective: Vitals:   05/21/18 1356 05/21/18 1558 05/21/18 2046 05/22/18 0513  BP: 117/67 132/89 127/65 112/80  Pulse: 86 97 93 80  Resp: 18 20 19 17   Temp:  99.8 F (37.7 C) 99.2 F (37.3 C) 98.2 F (36.8 C)  TempSrc:  Oral Oral Oral  SpO2: 100% 100%  100% 99%  Weight:      Height:        Intake/Output Summary (Last 24 hours) at 05/22/2018 8119 Last data filed at 05/22/2018 1478 Gross per 24 hour  Intake 3320.26 ml  Output -  Net 3320.26 ml   Filed Weights   05/21/18 0855  Weight: 99.8 kg    Examination:  General exam: Appears calm and comfortable ,Not in distress,average built HEENT:PERRL,Oral mucosa moist, Ear/Nose normal on gross exam Respiratory system: Bilateral equal air entry, normal vesicular breath sounds, no wheezes or crackles  Cardiovascular system: S1 & S2 heard, RRR. No JVD, murmurs, rubs, gallops or clicks. No pedal edema. Gastrointestinal system: Abdomen is nondistended, soft .  Mild generalized tenderness. no organomegaly or masses felt. Normal bowel sounds heard. Central nervous system: Alert and oriented. No focal neurological deficits. Extremities: No edema, no clubbing ,no cyanosis, distal peripheral pulses palpable. Skin: No rashes, lesions or ulcers,no icterus ,no pallor MSK: Normal muscle bulk,tone ,power Psychiatry: Judgement and insight appear normal. Mood & affect appropriate.     Data Reviewed: I have personally reviewed following labs and imaging studies  CBC: Recent Labs  Lab 05/21/18 1013 05/22/18 0604  WBC 12.7* 9.1  NEUTROABS 9.0*  --   HGB 13.2 11.6*  HCT 40.3 36.4  MCV 85.6 86.5  PLT 207 195   Basic Metabolic Panel: Recent Labs  Lab 05/21/18 1013 05/22/18 0604  NA 138 138  K 3.4* 3.6  CL 104 107  CO2 25 23  GLUCOSE 176* 130*  BUN 10 8  CREATININE 0.84 0.71  CALCIUM 9.3 8.5*   GFR: Estimated Creatinine Clearance: 105 mL/min (by C-G formula based on SCr of 0.71  mg/dL). Liver Function Tests: Recent Labs  Lab 05/21/18 1013 05/22/18 0604  AST 14* 11*  ALT 17 13  ALKPHOS 67 62  BILITOT 0.5 0.8  PROT 7.0 6.5  ALBUMIN 3.8 3.4*   Recent Labs  Lab 05/21/18 1013  LIPASE 30   No results for input(s): AMMONIA in the last 168 hours. Coagulation Profile: No  results for input(s): INR, PROTIME in the last 168 hours. Cardiac Enzymes: No results for input(s): CKTOTAL, CKMB, CKMBINDEX, TROPONINI in the last 168 hours. BNP (last 3 results) No results for input(s): PROBNP in the last 8760 hours. HbA1C: No results for input(s): HGBA1C in the last 72 hours. CBG: No results for input(s): GLUCAP in the last 168 hours. Lipid Profile: No results for input(s): CHOL, HDL, LDLCALC, TRIG, CHOLHDL, LDLDIRECT in the last 72 hours. Thyroid Function Tests: No results for input(s): TSH, T4TOTAL, FREET4, T3FREE, THYROIDAB in the last 72 hours. Anemia Panel: No results for input(s): VITAMINB12, FOLATE, FERRITIN, TIBC, IRON, RETICCTPCT in the last 72 hours. Sepsis Labs: No results for input(s): PROCALCITON, LATICACIDVEN in the last 168 hours.  No results found for this or any previous visit (from the past 240 hour(s)).       Radiology Studies: Ct Abdomen Pelvis W Contrast  Result Date: 05/21/2018 CLINICAL DATA:  Acute upper abdominal pain. EXAM: CT ABDOMEN AND PELVIS WITH CONTRAST TECHNIQUE: Multidetector CT imaging of the abdomen and pelvis was performed using the standard protocol following bolus administration of intravenous contrast. CONTRAST:  15mL ISOVUE-300 IOPAMIDOL (ISOVUE-300) INJECTION 61% orally, ISOVUE-300 IOPAMIDOL (ISOVUE-300) INJECTION 61% intravenously. COMPARISON:  None. FINDINGS: Lower chest: No acute abnormality. Hepatobiliary: No focal liver abnormality is seen. No gallstones, gallbladder wall thickening, or biliary dilatation. Pancreas: Unremarkable. No pancreatic ductal dilatation or surrounding inflammatory changes. Spleen: Normal in size without focal abnormality. Adrenals/Urinary Tract: Adrenal glands are unremarkable. Kidneys are normal, without renal calculi, focal lesion, or hydronephrosis. Bladder is unremarkable. Stomach/Bowel: The stomach and appendix appear normal. There is no evidence of bowel obstruction. Sigmoid  diverticulitis is noted with collection of fluid and gas measuring 3.8 x 2.6 cm adjacent to sigmoid colon consistent with contained perforation and developing phlegmon or abscess. There is wall thickening of adjacent terminal ileum which most likely is secondary inflammation. Vascular/Lymphatic: No significant vascular findings are present. No enlarged abdominal or pelvic lymph nodes. Reproductive: Uterus and right ovary are unremarkable. 4.1 cm left ovarian cyst is noted. Other: Small amount of free fluid is noted in the pelvis which may be inflammatory in etiology. Musculoskeletal: No acute or significant osseous findings. IMPRESSION: Sigmoid diverticulitis is noted with adjacent 3.8 x 2.6 cm abnormality containing gas and fluid concerning for contained perforation and developing phlegmon or abscess. This is resulting in secondary inflammatory changes involving the adjacent terminal ileum. 4.1 cm left ovarian cyst is noted. Ultrasound is recommended for further evaluation. Electronically Signed   By: Lupita Raider, M.D.   On: 05/21/2018 11:59        Scheduled Meds: . ciprofloxacin  500 mg Oral BID  . enoxaparin (LOVENOX) injection  40 mg Subcutaneous Q24H  . metroNIDAZOLE  500 mg Oral Q8H  . saccharomyces boulardii  250 mg Oral BID   Continuous Infusions: . sodium chloride Stopped (05/21/18 1734)  . sodium chloride Stopped (05/21/18 1509)  . sodium chloride 150 mL/hr at 05/22/18 0839     LOS: 1 day    Time spent:25 mins. More than 50% of that time was spent in counseling and/or coordination of  care.      Burnadette Pop, MD [facility] Pager [phone]  If 7PM-7AM, please contact night-coverage [email or web address] Password [email or web address] 05/22/2018, 9:38 AM

## 2018-05-23 LAB — CBC
HCT: 36.5 % (ref 36.0–46.0)
Hemoglobin: 11.9 g/dL — ABNORMAL LOW (ref 12.0–15.0)
MCH: 28.1 pg (ref 26.0–34.0)
MCHC: 32.6 g/dL (ref 30.0–36.0)
MCV: 86.3 fL (ref 80.0–100.0)
PLATELETS: 179 10*3/uL (ref 150–400)
RBC: 4.23 MIL/uL (ref 3.87–5.11)
RDW: 13.2 % (ref 11.5–15.5)
WBC: 7.7 10*3/uL (ref 4.0–10.5)
nRBC: 0 % (ref 0.0–0.2)

## 2018-05-23 MED ORDER — CIPROFLOXACIN HCL 500 MG PO TABS: 500.0000 mg | ORAL_TABLET | Freq: Two times a day (BID) | ORAL | 0 refills | Status: AC

## 2018-05-23 MED ORDER — METRONIDAZOLE 500 MG PO TABS: 500.0000 mg | ORAL_TABLET | Freq: Three times a day (TID) | ORAL | 0 refills | Status: AC

## 2018-05-23 MED ORDER — SACCHAROMYCES BOULARDII 250 MG PO CAPS: 250.0000 mg | ORAL_CAPSULE | Freq: Two times a day (BID) | ORAL | 0 refills | Status: AC

## 2018-05-23 MED ORDER — TRAMADOL HCL 50 MG PO TABS: 50.0000 mg | ORAL_TABLET | Freq: Four times a day (QID) | ORAL | 0 refills | Status: AC | PRN

## 2018-05-23 NOTE — Discharge Summary (Addendum)
Physician Discharge Summary  Chelsea Rowe ZOX:[id number] DOB: 11-09-1970 DOA: 05/21/2018  [facility]P: [facility]  Admit date: 05/21/2018 Discharge date: 05/23/2018  Admitted From: Home Disposition:  Home  Discharge Condition:Stable CODE STATUS:FULL Diet recommendation:  Regular   Brief/Interim Summary: Patient is a 47 year old female with no significant past medical history was transferred from [facility] for the management of diverticulitis.  She presented to the emergency department with complaints of abdominal pain with associated nausea and vomiting.  CT abdomen/pelvis showed sigmoid diverticulitis with question of contained perforation.  General surgery consulted.  Started on antibiotics. Her abdominal pain has significantly improved with conservative management.  She has tolerated the advance diet. Plan is to discharge her home today with oral antibiotics.  She will follow-up with general surgery as an outpatient in 2 weeks.  Following problems were addressed during hospitalization:   Acute diverticulitis: CT imaging showed acute diverticulitis involving the sigmoid colon with inflammatory changes suggesting phlegmon versus abscess versus contained perforation. General surgery was following.  Her abdominal pain is much better today.  Denies any nausea or vomiting. Has been having  bowel movement.    Diet has been advanced and she has tolerated that. She will follow-up with general surgery as an outpatient.  Recommended to have fibre rich diet.  Continue antibiotics for 8 more days.  Ovarian cyst:4.1 cm left ovarian cyst was noted. Ultrasound is recommended for further evaluation.This can be done as an outpatient.    Discharge Diagnoses:  Active Problems:   Diverticulitis   Abscess of sigmoid colon due to diverticulitis    Discharge Instructions  Discharge Instructions    Diet - low sodium heart healthy   Complete by:  As  directed    Discharge instructions   Complete by:  As directed    1) Please follow general surgery as an outpatient in 2 weeks. 2)Take prescribed medications as instructed. 3) Take fiber rich diet. 4)CT scan done here showed 4.1 cm left ovarian cyst . We recommend to do an ultrasound of the pelvis as an outpatient by following with your primary care physician.   Increase activity slowly   Complete by:  As directed      Allergies as of 05/23/2018   No Known Allergies     Medication List    TAKE these medications   ciprofloxacin 500 MG tablet Commonly known as:  CIPRO Take 1 tablet (500 mg total) by mouth 2 (two) times daily for 8 days. Start taking on:  05/24/2018   metroNIDAZOLE 500 MG tablet Commonly known as:  FLAGYL Take 1 tablet (500 mg total) by mouth every 8 (eight) hours for 8 days. Start taking on:  05/24/2018   saccharomyces boulardii 250 MG capsule Commonly known as:  FLORASTOR Take 1 capsule (250 mg total) by mouth 2 (two) times daily.   simethicone 80 MG chewable tablet Commonly known as:  MYLICON Chew 80 mg by mouth daily as needed for flatulence.   traMADol 50 MG tablet Commonly known as:  ULTRAM Take 1 tablet (50 mg total) by mouth every 6 (six) hours as needed for up to 5 days.      Follow-up Information    Andria Meuse, MD. Schedule an appointment as soon as possible for a visit in 2 week(s).   Specialty:  General Surgery Contact information: [address] [place] [address] [phone]        [facility]. Schedule an appointment as soon  as possible for a visit in 1 week(s).   Specialty:  Family Medicine Contact information: [address] Dorothyann [place] [address] [phone]          No Known Allergies  Consultations:  Surgery   Procedures/Studies: Ct Abdomen Pelvis W Contrast  Result Date: 05/21/2018 CLINICAL DATA:  Acute upper abdominal pain. EXAM: CT ABDOMEN AND PELVIS  WITH CONTRAST TECHNIQUE: Multidetector CT imaging of the abdomen and pelvis was performed using the standard protocol following bolus administration of intravenous contrast. CONTRAST:  15mL ISOVUE-300 IOPAMIDOL (ISOVUE-300) INJECTION 61% orally, ISOVUE-300 IOPAMIDOL (ISOVUE-300) INJECTION 61% intravenously. COMPARISON:  None. FINDINGS: Lower chest: No acute abnormality. Hepatobiliary: No focal liver abnormality is seen. No gallstones, gallbladder wall thickening, or biliary dilatation. Pancreas: Unremarkable. No pancreatic ductal dilatation or surrounding inflammatory changes. Spleen: Normal in size without focal abnormality. Adrenals/Urinary Tract: Adrenal glands are unremarkable. Kidneys are normal, without renal calculi, focal lesion, or hydronephrosis. Bladder is unremarkable. Stomach/Bowel: The stomach and appendix appear normal. There is no evidence of bowel obstruction. Sigmoid diverticulitis is noted with collection of fluid and gas measuring 3.8 x 2.6 cm adjacent to sigmoid colon consistent with contained perforation and developing phlegmon or abscess. There is wall thickening of adjacent terminal ileum which most likely is secondary inflammation. Vascular/Lymphatic: No significant vascular findings are present. No enlarged abdominal or pelvic lymph nodes. Reproductive: Uterus and right ovary are unremarkable. 4.1 cm left ovarian cyst is noted. Other: Small amount of free fluid is noted in the pelvis which may be inflammatory in etiology. Musculoskeletal: No acute or significant osseous findings. IMPRESSION: Sigmoid diverticulitis is noted with adjacent 3.8 x 2.6 cm abnormality containing gas and fluid concerning for contained perforation and developing phlegmon or abscess. This is resulting in secondary inflammatory changes involving the adjacent terminal ileum. 4.1 cm left ovarian cyst is noted. Ultrasound is recommended for further evaluation. Electronically Signed   By: Lupita Raider, M.D.   On:  05/21/2018 11:59      Discharge Exam: Vitals:   05/22/18 2054 05/23/18 0451  BP: 121/82 99/76  Pulse: 74 86  Resp: 20 16  Temp: 98.6 F (37 C) 98.2 F (36.8 C)  SpO2: 100% 100%   Vitals:   05/21/18 2046 05/22/18 0513 05/22/18 2054 05/23/18 0451  BP: 127/65 112/80 121/82 99/76  Pulse: 93 80 74 86  Resp: 19 17 20 16   Temp: 99.2 F (37.3 C) 98.2 F (36.8 C) 98.6 F (37 C) 98.2 F (36.8 C)  TempSrc: Oral Oral Oral Oral  SpO2: 100% 99% 100% 100%  Weight:      Height:        General: Pt is alert, awake, not in acute distress Cardiovascular: RRR, S1/S2 +, no rubs, no gallops Respiratory: CTA bilaterally, no wheezing, no rhonchi Abdominal: Soft, NT, ND, bowel sounds + Extremities: no edema, no cyanosis    The results of significant diagnostics from this hospitalization (including imaging, microbiology, ancillary and laboratory) are listed below for reference.     Microbiology: No results found for this or any previous visit (from the past 240 hour(s)).   Labs: BNP (last 3 results) No results for input(s): BNP in the last 8760 hours. Basic Metabolic Panel: Recent Labs  Lab 05/21/18 1013 05/22/18 0604  NA 138 138  K 3.4* 3.6  CL 104 107  CO2 25 23  GLUCOSE 176* 130*  BUN 10 8  CREATININE 0.84 0.71  CALCIUM 9.3 8.5*   Liver Function Tests: Recent Labs  Lab 05/21/18 1013 05/22/18 0604  AST 14* 11*  ALT 17 13  ALKPHOS 67 62  BILITOT 0.5 0.8  PROT 7.0 6.5  ALBUMIN 3.8 3.4*   Recent Labs  Lab 05/21/18 1013  LIPASE 30   No results for input(s): AMMONIA in the last 168 hours. CBC: Recent Labs  Lab 05/21/18 1013 05/22/18 0604 05/23/18 0535  WBC 12.7* 9.1 7.7  NEUTROABS 9.0*  --   --   HGB 13.2 11.6* 11.9*  HCT 40.3 36.4 36.5  MCV 85.6 86.5 86.3  PLT 207 195 179   Cardiac Enzymes: No results for input(s): CKTOTAL, CKMB, CKMBINDEX, TROPONINI in the last 168 hours. BNP: Invalid input(s): POCBNP CBG: No results for input(s): GLUCAP in the  last 168 hours. D-Dimer No results for input(s): DDIMER in the last 72 hours. Hgb A1c No results for input(s): HGBA1C in the last 72 hours. Lipid Profile No results for input(s): CHOL, HDL, LDLCALC, TRIG, CHOLHDL, LDLDIRECT in the last 72 hours. Thyroid function studies No results for input(s): TSH, T4TOTAL, T3FREE, THYROIDAB in the last 72 hours.  Invalid input(s): FREET3 Anemia work up No results for input(s): VITAMINB12, FOLATE, FERRITIN, TIBC, IRON, RETICCTPCT in the last 72 hours. Urinalysis No results found for: COLORURINE, APPEARANCEUR, LABSPEC, PHURINE, GLUCOSEU, HGBUR, BILIRUBINUR, KETONESUR, PROTEINUR, UROBILINOGEN, NITRITE, LEUKOCYTESUR Sepsis Labs Invalid input(s): PROCALCITONIN,  WBC,  LACTICIDVEN Microbiology No results found for this or any previous visit (from the past 240 hour(s)).  Please note: You were cared for by a hospitalist during your hospital stay. Once you are discharged, your primary care physician will handle any further medical issues. Please note that NO REFILLS for any discharge medications will be authorized once you are discharged, as it is imperative that you return to your primary care physician (or establish a relationship with a primary care physician if you do not have one) for your post hospital discharge needs so that they can reassess your need for medications and monitor your lab values.    Time coordinating discharge: 40 minutes  SIGNED:   Burnadette Pop, MD  [facility] 05/23/2018, 12:16 PM Pager [phone]  If 7PM-7AM, please contact night-coverage [email or web address] Password [email or web address]

## 2018-05-23 NOTE — Progress Notes (Signed)
Chelsea Rowe to be D/C'd Home per MD order.  Discussed prescriptions and follow up appointments with the patient. Prescriptions given to patient, medication list explained in detail. Pt verbalized understanding.  Allergies as of 05/23/2018   No Known Allergies     Medication List    TAKE these medications   ciprofloxacin 500 MG tablet Commonly known as:  CIPRO Take 1 tablet (500 mg total) by mouth 2 (two) times daily for 8 days. Start taking on:  05/24/2018   metroNIDAZOLE 500 MG tablet Commonly known as:  FLAGYL Take 1 tablet (500 mg total) by mouth every 8 (eight) hours for 8 days. Start taking on:  05/24/2018   saccharomyces boulardii 250 MG capsule Commonly known as:  FLORASTOR Take 1 capsule (250 mg total) by mouth 2 (two) times daily.   simethicone 80 MG chewable tablet Commonly known as:  MYLICON Chew 80 mg by mouth daily as needed for flatulence.   traMADol 50 MG tablet Commonly known as:  ULTRAM Take 1 tablet (50 mg total) by mouth every 6 (six) hours as needed for up to 5 days.       Vitals:   05/23/18 0451 05/23/18 1338  BP: 99/76 114/80  Pulse: 86 74  Resp: 16 18  Temp: 98.2 F (36.8 C) 98.9 F (37.2 C)  SpO2: 100% 100%    Skin clean, dry and intact without evidence of skin break down, no evidence of skin tears noted. IV catheter discontinued intact. Site without signs and symptoms of complications. Dressing and pressure applied. Pt denies pain at this time. No complaints noted.  An After Visit Summary was printed and given to the patient. Patient escorted via WC, and D/C home via private auto.  Mariann Barter BSN, RN [employer] [facility] Phone [phone]

## 2018-05-23 NOTE — Progress Notes (Signed)
Assessment & Plan: Sigmoid diverticulitis with phlegmon  Markedly improved with resolution of pain, tolerating full liquid diet  Dietician to see and instruct in diverticular diet  On oral abx's - cipro and flagyl - would discharge on oral abx's for 10 day course  Recommend Rx for Tramadol for pain prn  OK for discharge home today from surgical standpoint.  Will arrange follow up at [facility] office with Dr. Angelena Form in 2 weeks.        Darnell Level, MD       [facility]       Office: [phone]   Chief Complaint: Sigmoid diverticulitis  Subjective: Patient comfortable, having BM's, tolerating full liquid diet.  Denies pain.  Objective: Vital signs in last 24 hours: Temp:  [98.2 F (36.8 C)-98.6 F (37 C)] 98.2 F (36.8 C) (10/26 0451) Pulse Rate:  [74-86] 86 (10/26 0451) Resp:  [16-20] 16 (10/26 0451) BP: (99-121)/(76-82) 99/76 (10/26 0451) SpO2:  [100 %] 100 % (10/26 0451) Last BM Date: 05/21/18  Intake/Output from previous day: 10/25 0701 - 10/26 0700 In: 972.4 [P.O.:620; I.V.:275.4; IV Piggyback:77] Out: -  Intake/Output this shift: Total I/O In: 360 [P.O.:360] Out: -   Physical Exam: HEENT - sclerae clear, mucous membranes moist Neck - soft Chest - clear bilaterally Cor - RRR Abdomen - soft, obese; minimal LLQ tenderness; no guarding; BS present Ext - no edema, non-tender Neuro - alert & oriented, no focal deficits  Lab Results:  Recent Labs    05/22/18 0604 05/23/18 0535  WBC 9.1 7.7  HGB 11.6* 11.9*  HCT 36.4 36.5  PLT 195 179   BMET Recent Labs    05/21/18 1013 05/22/18 0604  NA 138 138  K 3.4* 3.6  CL 104 107  CO2 25 23  GLUCOSE 176* 130*  BUN 10 8  CREATININE 0.84 0.71  CALCIUM 9.3 8.5*   PT/INR No results for input(s): LABPROT, INR in the last 72 hours. Comprehensive Metabolic Panel:    Component Value Date/Time   NA 138 05/22/2018 0604   NA 138 05/21/2018 1013   K 3.6 05/22/2018 0604   K 3.4 (L)  05/21/2018 1013   CL 107 05/22/2018 0604   CL 104 05/21/2018 1013   CO2 23 05/22/2018 0604   CO2 25 05/21/2018 1013   BUN 8 05/22/2018 0604   BUN 10 05/21/2018 1013   CREATININE 0.71 05/22/2018 0604   CREATININE 0.84 05/21/2018 1013   GLUCOSE 130 (H) 05/22/2018 0604   GLUCOSE 176 (H) 05/21/2018 1013   CALCIUM 8.5 (L) 05/22/2018 0604   CALCIUM 9.3 05/21/2018 1013   AST 11 (L) 05/22/2018 0604   AST 14 (L) 05/21/2018 1013   ALT 13 05/22/2018 0604   ALT 17 05/21/2018 1013   ALKPHOS 62 05/22/2018 0604   ALKPHOS 67 05/21/2018 1013   BILITOT 0.8 05/22/2018 0604   BILITOT 0.5 05/21/2018 1013   PROT 6.5 05/22/2018 0604   PROT 7.0 05/21/2018 1013   ALBUMIN 3.4 (L) 05/22/2018 0604   ALBUMIN 3.8 05/21/2018 1013    Studies/Results: Ct Abdomen Pelvis W Contrast  Result Date: 05/21/2018 CLINICAL DATA:  Acute upper abdominal pain. EXAM: CT ABDOMEN AND PELVIS WITH CONTRAST TECHNIQUE: Multidetector CT imaging of the abdomen and pelvis was performed using the standard protocol following bolus administration of intravenous contrast. CONTRAST:  15mL ISOVUE-300 IOPAMIDOL (ISOVUE-300) INJECTION 61% orally, ISOVUE-300 IOPAMIDOL (ISOVUE-300) INJECTION 61% intravenously. COMPARISON:  None. FINDINGS: Lower chest: No acute abnormality. Hepatobiliary:  No focal liver abnormality is seen. No gallstones, gallbladder wall thickening, or biliary dilatation. Pancreas: Unremarkable. No pancreatic ductal dilatation or surrounding inflammatory changes. Spleen: Normal in size without focal abnormality. Adrenals/Urinary Tract: Adrenal glands are unremarkable. Kidneys are normal, without renal calculi, focal lesion, or hydronephrosis. Bladder is unremarkable. Stomach/Bowel: The stomach and appendix appear normal. There is no evidence of bowel obstruction. Sigmoid diverticulitis is noted with collection of fluid and gas measuring 3.8 x 2.6 cm adjacent to sigmoid colon consistent with contained perforation and developing  phlegmon or abscess. There is wall thickening of adjacent terminal ileum which most likely is secondary inflammation. Vascular/Lymphatic: No significant vascular findings are present. No enlarged abdominal or pelvic lymph nodes. Reproductive: Uterus and right ovary are unremarkable. 4.1 cm left ovarian cyst is noted. Other: Small amount of free fluid is noted in the pelvis which may be inflammatory in etiology. Musculoskeletal: No acute or significant osseous findings. IMPRESSION: Sigmoid diverticulitis is noted with adjacent 3.8 x 2.6 cm abnormality containing gas and fluid concerning for contained perforation and developing phlegmon or abscess. This is resulting in secondary inflammatory changes involving the adjacent terminal ileum. 4.1 cm left ovarian cyst is noted. Ultrasound is recommended for further evaluation. Electronically Signed   By: Lupita Raider, M.D.   On: 05/21/2018 11:59      Ricke Kimoto M 05/23/2018  Patient ID: Chelsea Rowe, female   DOB: Oct 12, 1970, 47 y.o.   MRN: [id number]

## 2019-05-29 IMAGING — CT CT ABD-PELV W/ CM
2 of 5 series · 16 of 46 positions shown, 18 images · IV contrast (APPLIED)
Comparison: None.

CLINICAL DATA: Acute upper abdominal pain.

EXAM:
CT ABDOMEN AND PELVIS WITH CONTRAST
TECHNIQUE: Multidetector CT imaging of the abdomen and pelvis was performed
using the standard protocol following bolus administration of
intravenous contrast.
CONTRAST:  15mL 99GU1O-AXX IOPAMIDOL (99GU1O-AXX) INJECTION 61%
orally, 100mL 99GU1O-AXX IOPAMIDOL (99GU1O-AXX) INJECTION 61%
intravenously.

[Series 2: axial st · axial · 0.98mm/px · z∈[-520,-95]mm · 13 of 97 slices shown, 15 images]
[im 6/97  soft-tissue]
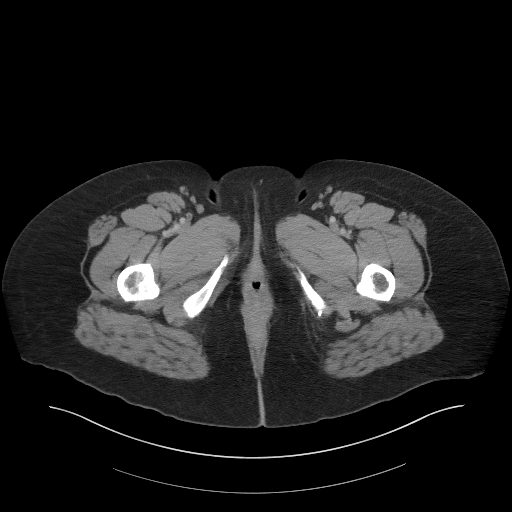
[im 6/97  bone]
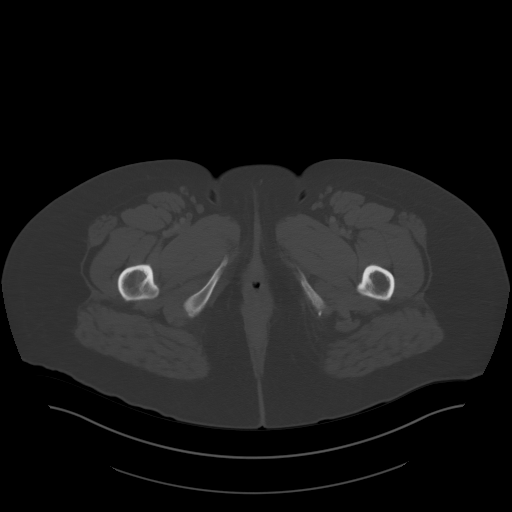
[im 16/97  soft-tissue]
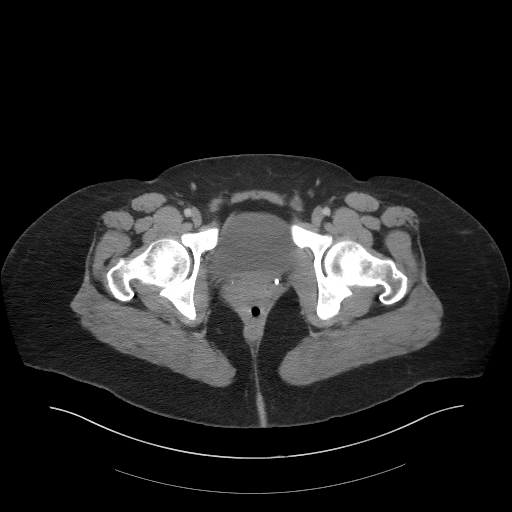
[im 21/97  soft-tissue]
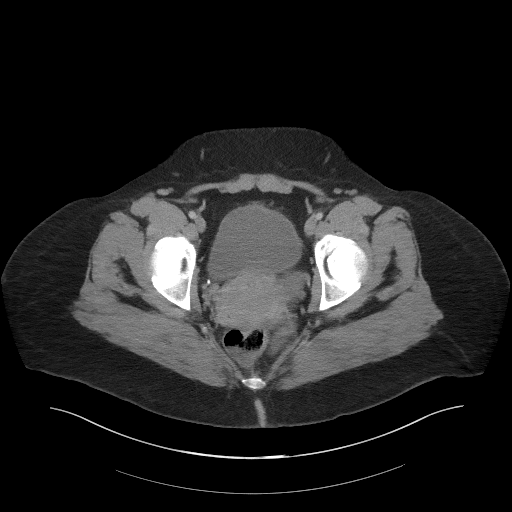
[im 26/97  soft-tissue]
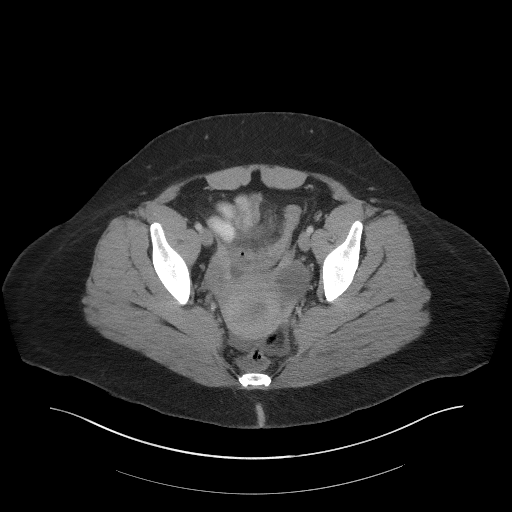
[im 36/97  soft-tissue]
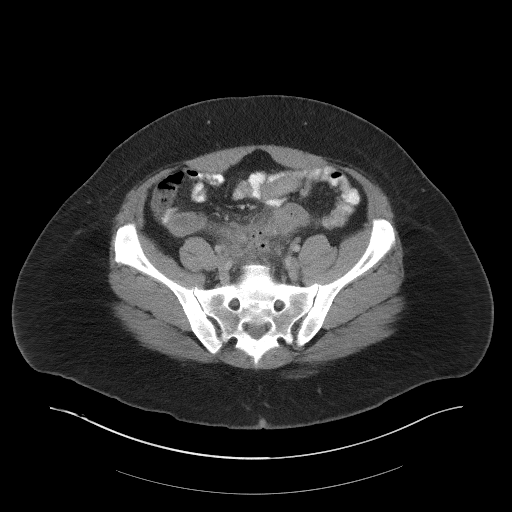
[im 41/97  soft-tissue]
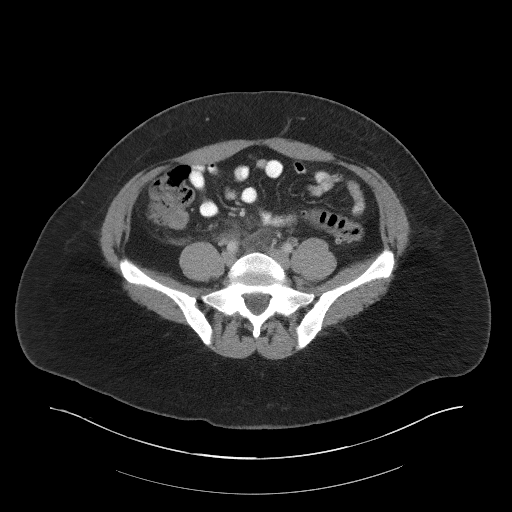
[im 51/97  soft-tissue]
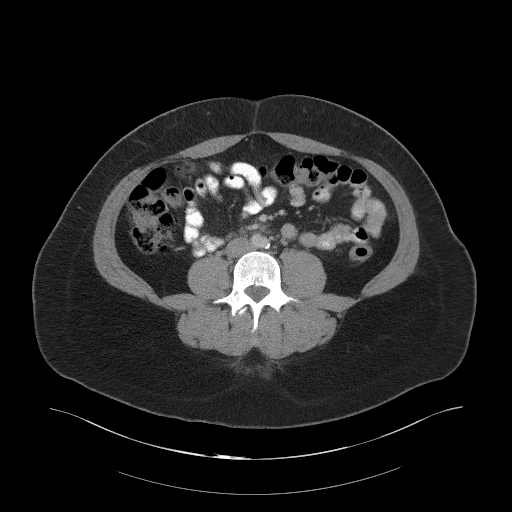
[im 56/97  soft-tissue]
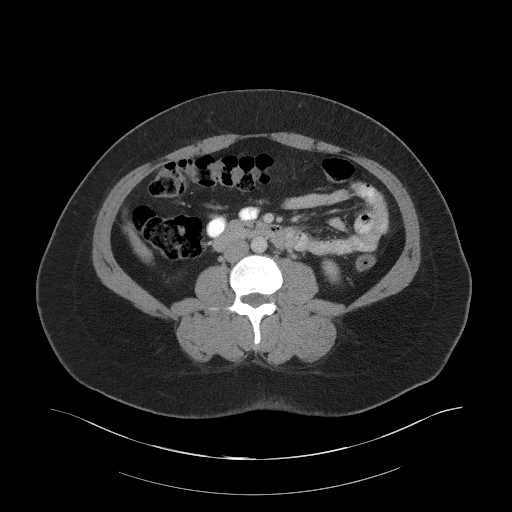
[im 61/97  soft-tissue]
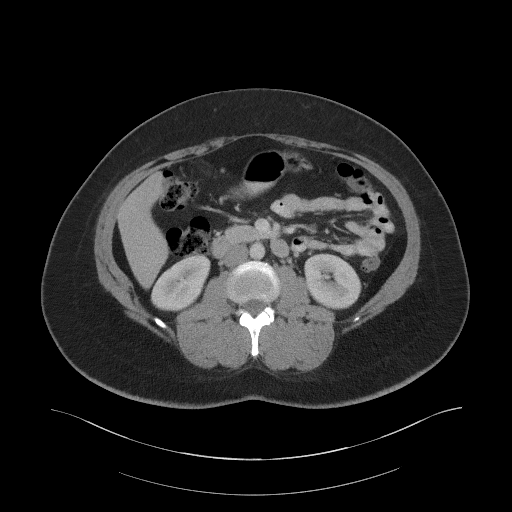
[im 61/97  bone]
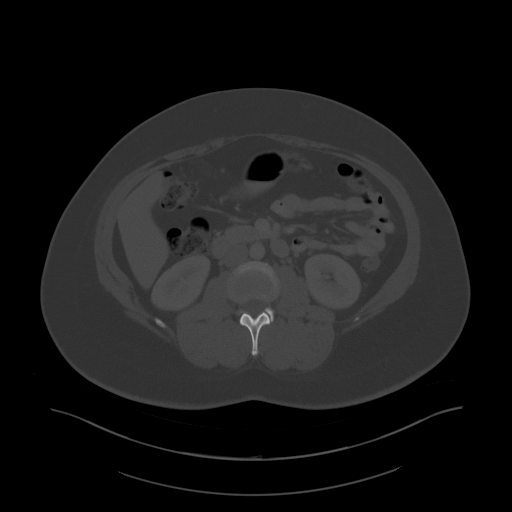
[im 71/97  soft-tissue]
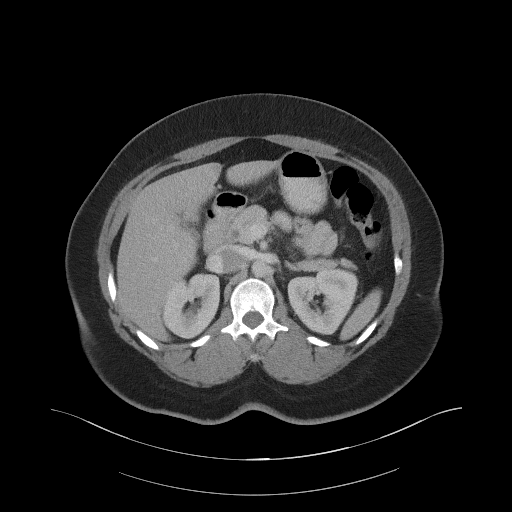
[im 76/97  soft-tissue]
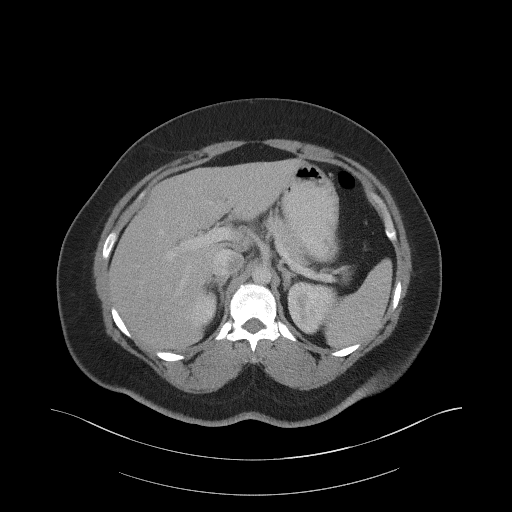
[im 81/97  soft-tissue]
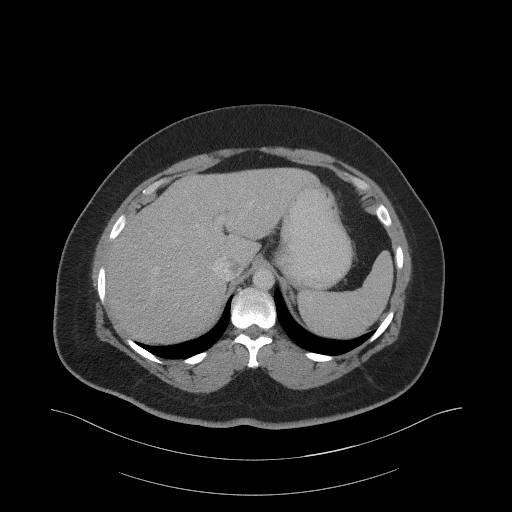
[im 91/97  soft-tissue]
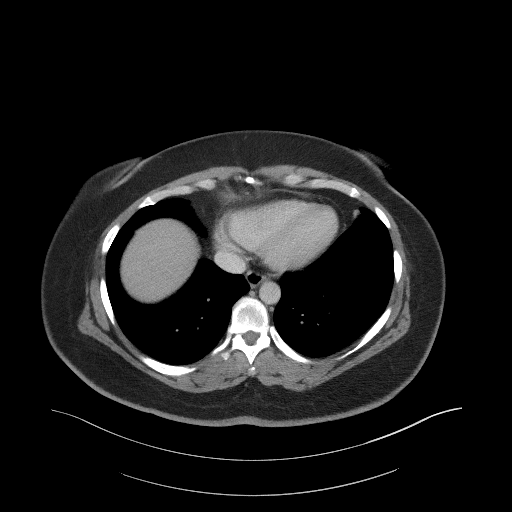

[Series 5: coronal st · coronal · 0.90mm/px · 3 of 115 slices shown]
[im 39/115  soft-tissue]
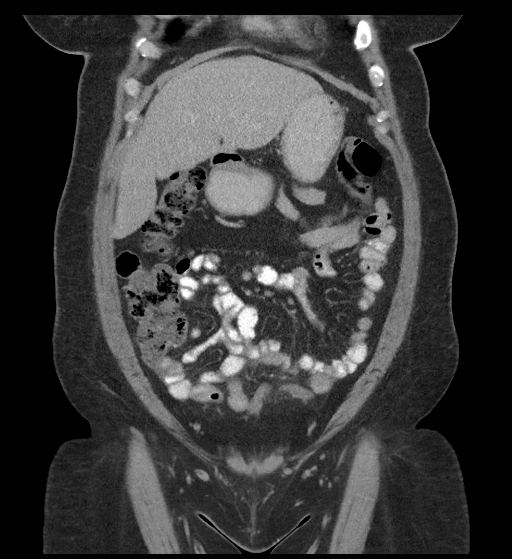
[im 51/115  soft-tissue]
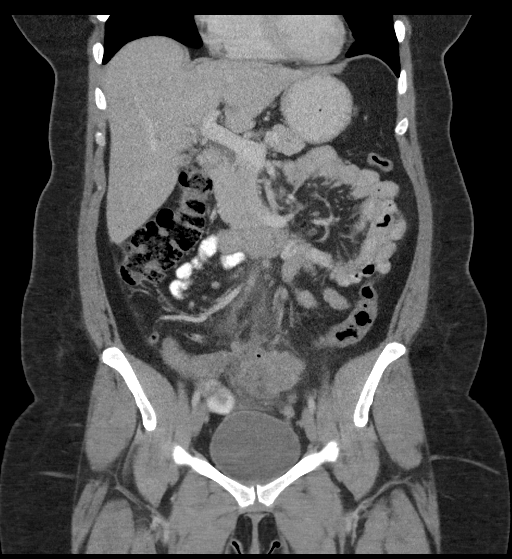
[im 64/115  soft-tissue]
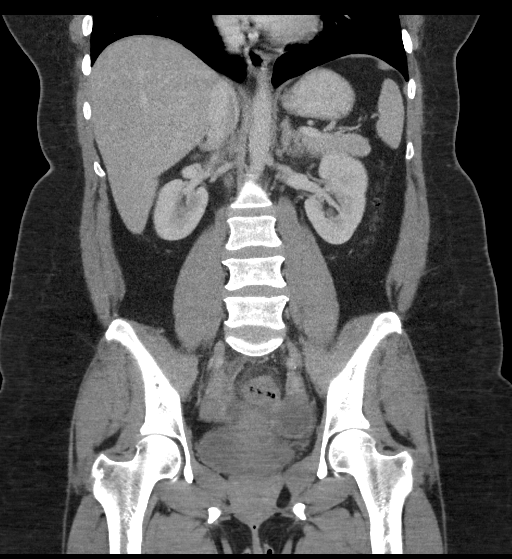

[16 of 46 positions shown; findings below may reference images not displayed]

FINDINGS: Lower chest: No acute abnormality.

Hepatobiliary: No focal liver abnormality is seen. No gallstones,
gallbladder wall thickening, or biliary dilatation.

Pancreas: Unremarkable. No pancreatic ductal dilatation or
surrounding inflammatory changes.

Spleen: Normal in size without focal abnormality.

Adrenals/Urinary Tract: Adrenal glands are unremarkable. Kidneys are
normal, without renal calculi, focal lesion, or hydronephrosis.
Bladder is unremarkable.

Stomach/Bowel: The stomach and appendix appear normal. There is no
evidence of bowel obstruction. Sigmoid diverticulitis is noted with
collection of fluid and gas measuring 3.8 x 2.6 cm adjacent to
sigmoid colon consistent with contained perforation and developing
phlegmon or abscess. There is wall thickening of adjacent terminal
ileum which most likely is secondary inflammation.

Vascular/Lymphatic: No significant vascular findings are present. No
enlarged abdominal or pelvic lymph nodes.

Reproductive: Uterus and right ovary are unremarkable. 4.1 cm left
ovarian cyst is noted.

Other: Small amount of free fluid is noted in the pelvis which may
be inflammatory in etiology.

Musculoskeletal: No acute or significant osseous findings.
IMPRESSION: Sigmoid diverticulitis is noted with adjacent 3.8 x 2.6 cm
abnormality containing gas and fluid concerning for contained
perforation and developing phlegmon or abscess. This is resulting in
secondary inflammatory changes involving the adjacent terminal
ileum.

4.1 cm left ovarian cyst is noted. Ultrasound is recommended for
further evaluation.
